# Patient Record
Sex: Male | Born: 1990 | Race: Black or African American | Hispanic: No | Marital: Single | Smoking: Current every day smoker
Health system: Southern US, Community
[De-identification: ages and names within clinical notes are randomized; demographics above are authoritative.]

## PROBLEM LIST (undated history)

## (undated) DIAGNOSIS — H3320 Serous retinal detachment, unspecified eye: Secondary | ICD-10-CM

## (undated) DIAGNOSIS — Z905 Acquired absence of kidney: Secondary | ICD-10-CM

## (undated) DIAGNOSIS — T148XXA Other injury of unspecified body region, initial encounter: Secondary | ICD-10-CM

## (undated) DIAGNOSIS — C801 Malignant (primary) neoplasm, unspecified: Secondary | ICD-10-CM

## (undated) HISTORY — PX: OTHER SURGICAL HISTORY: SHX169

## (undated) HISTORY — PX: NEPHRECTOMY: SHX65

## (undated) HISTORY — PX: EYE SURGERY: SHX253

---

## 2012-07-11 ENCOUNTER — Encounter (HOSPITAL_COMMUNITY): Payer: Self-pay | Admitting: *Deleted

## 2012-07-11 ENCOUNTER — Emergency Department (HOSPITAL_COMMUNITY)
Admission: EM | Admit: 2012-07-11 | Discharge: 2012-07-11 | Disposition: A | Discharge status: Home / Self Care | Payer: Federal, State, Local not specified - PPO | Attending: Emergency Medicine | Admitting: Emergency Medicine

## 2012-07-11 DIAGNOSIS — J029 Acute pharyngitis, unspecified: Secondary | ICD-10-CM

## 2012-07-11 DIAGNOSIS — Z85528 Personal history of other malignant neoplasm of kidney: Secondary | ICD-10-CM | POA: Diagnosis not present

## 2012-07-11 DIAGNOSIS — J039 Acute tonsillitis, unspecified: Secondary | ICD-10-CM | POA: Diagnosis not present

## 2012-07-11 LAB — RAPID STREP SCREEN (MED CTR MEBANE ONLY): Streptococcus, Group A Screen (Direct): NEGATIVE

## 2012-07-11 MED ORDER — BENZOCAINE 20 % MT SOLN: 1.0000 "application " | Freq: Three times a day (TID) | OROMUCOSAL | Status: DC | PRN

## 2012-07-11 MED ORDER — DEXAMETHASONE 10 MG/ML FOR PEDIATRIC ORAL USE
10.0000 mg | Freq: Once | INTRAMUSCULAR | Status: AC
  Administered 2012-07-11: 10 mg via ORAL
  Filled 2012-07-11: qty 1

## 2012-07-11 MED ORDER — PENICILLIN G BENZATHINE 1200000 UNIT/2ML IM SUSP
1.2000 10*6.[IU] | Freq: Once | INTRAMUSCULAR | Status: AC
  Administered 2012-07-11: 1.2 10*6.[IU] via INTRAMUSCULAR
  Filled 2012-07-11: qty 2

## 2012-07-11 NOTE — ED Notes (Signed)
Discharge instructions reviewed. Pt verbalized understanding.  

## 2012-07-11 NOTE — ED Provider Notes (Signed)
History    This chart was scribed for non-physician practitioner working with Joe Benton, * by ED Scribe, Joe Benton. This patient was seen in room TR11C/TR11C and the patient's care was started at 4:26 PM.  CSN: [id number]  Arrival date & time 07/11/12  1416   First MD Initiated Contact with Patient 07/11/12 1626      Chief Complaint  Patient presents with  . Sore Throat    (Consider location/radiation/quality/duration/timing/severity/associated sxs/prior treatment) Patient is a 22 y.o. male presenting with pharyngitis. The history is provided by the patient. Benton language interpreter was used.  Sore Throat Pertinent negatives include Benton chest pain, Benton abdominal pain, Benton headaches and Benton shortness of breath.   Joe Benton is a 22 y.o. male who presents to the Emergency Department complaining of moderate constant sore throat onset a week ago. Pt complains that his tonsils are red and swollen. Pt reports that he has had episodic fevers at home with associated cough and ha's. Pt reports he has had strep twice in the last year. Pt says he has trouble swallowing due to sore throat. Pt denies chills, cough, nausea, vomiting, diarrhea, SOB, weakness, and any other associated symptoms. Pt stated that he just moved from [place]. Past medical hx includes kidney cancer. Kidney was removed when 22 y/o.  Patient also states he has chronic enlarged tonsils and he is discussed with ENT. their removal, but has not had surgery.   History reviewed. Benton pertinent past medical history.  Past Surgical History  Procedure Laterality Date  . Kidney cancer      22 y/o.  kidney removed    History reviewed. Benton pertinent family history.  History  Substance Use Topics  . Smoking status: Never Smoker   . Smokeless tobacco: Not on file  . Alcohol Use: Yes     Comment: socially      Review of Systems  Constitutional: Negative for fever, diaphoresis, appetite change, fatigue and unexpected weight  change.  HENT: Positive for sore throat. Negative for drooling, mouth sores, trouble swallowing, neck stiffness and voice change.   Respiratory: Negative for cough, chest tightness, shortness of breath and wheezing.   Cardiovascular: Negative for chest pain.  Gastrointestinal: Negative for nausea, vomiting, abdominal pain, diarrhea and constipation.  Genitourinary: Negative for dysuria, urgency, frequency and hematuria.  Musculoskeletal: Negative for arthralgias.  Skin: Negative for rash.  Allergic/Immunologic: Negative for immunocompromised state.  Neurological: Negative for syncope, light-headedness and headaches.  Hematological: Does not bruise/bleed easily.  Psychiatric/Behavioral: The patient is not nervous/anxious.     Allergies  Review of patient's allergies indicates Benton known allergies.  Home Medications   Current Outpatient Rx  Name  Route  Sig  Dispense  Refill  . benzocaine (HURRICAINE) 20 % SOLN   Mouth/Throat   Use as directed 1 application in the mouth or throat 3 (three) times daily as needed.   9.75 mL   0     BP 117/97  Pulse 79  Temp(Src) 98.7 F (37.1 C)  Resp 19  SpO2 100%  Physical Exam  Constitutional: He is oriented to person, place, and time. He appears well-developed and well-nourished. Benton distress.  HENT:  Head: Normocephalic and atraumatic.  Right Ear: Tympanic membrane, external ear and ear canal normal. Tympanic membrane is not erythematous and not bulging.  Left Ear: Tympanic membrane, external ear and ear canal normal. Tympanic membrane is not erythematous and not bulging.  Nose: Mucosal edema and rhinorrhea present. Benton epistaxis. Right sinus exhibits  Benton maxillary sinus tenderness and Benton frontal sinus tenderness. Left sinus exhibits Benton maxillary sinus tenderness and Benton frontal sinus tenderness.  Mouth/Throat: Uvula is midline and mucous membranes are normal. Mucous membranes are not pale and not cyanotic. Benton edematous. Oropharyngeal exudate,  posterior oropharyngeal edema and posterior oropharyngeal erythema present. Benton tonsillar abscesses.  Canals are clear. mild mucosa edema. Tonsils are adnopathy. Benton other head or cervical endopathy   Eyes: Conjunctivae are normal. Pupils are equal, round, and reactive to light.  Neck: Normal range of motion and full passive range of motion without pain. Benton spinous process tenderness and Benton muscular tenderness present. Benton rigidity. Normal range of motion present. Benton Brudzinski's sign and Benton Kernig's sign noted.  Cardiovascular: Normal rate, normal heart sounds and intact distal pulses.   Pulmonary/Chest: Effort normal. Benton stridor. Benton respiratory distress. He has Benton rales.  Abdominal: Soft. Bowel sounds are normal.  Musculoskeletal: Normal range of motion.  Lymphadenopathy:       Head (right side): Tonsillar adenopathy present. Benton submental, Benton submandibular, Benton preauricular, Benton posterior auricular and Benton occipital adenopathy present.       Head (left side): Tonsillar adenopathy present. Benton submental, Benton submandibular, Benton preauricular, Benton posterior auricular and Benton occipital adenopathy present.    He has Benton cervical adenopathy.  Neurological: He is alert and oriented to person, place, and time.  Skin: Skin is dry. Benton rash noted. He is not diaphoretic.  Psychiatric: He has a normal mood and affect.    ED Course  Procedures (including critical care time) DIAGNOSTIC STUDIES: Oxygen Saturation is 100% on room air, normal by my interpretation.    COORDINATION OF CARE:   4:42 PM Discussed ED treatment with pt and pt agrees.   Labs Reviewed  RAPID STREP SCREEN   Benton results found.   1. Tonsillitis   2. Sore throat       MDM  Joe Benton presents for sore throat, DDX: Pharyngitis, strep throat, mono.  Patient with negative strep test; but swollen, erythematous tonsils with exudate on exam.  Pt afebrile with tonsillar exudate, cervical lymphadenopathy, & painful swallowing; diagnosis  of pharyngitis. Treated in the Ed with steroids and PCN IM.  Pt appears mildly dehydrated, discussed importance of water rehydration. Presentation non concerning for PTA or infxn spread to soft tissue. Benton trismus or uvula deviation. Specific return precautions discussed. Pt able to drink water in ED without difficulty with intact air way. Recommended PCP follow up.   1. Medications: Hurricaine spray, usual home medications 2. Treatment: rest, drink plenty of fluids, use as directed 3. Follow Up: Please followup with your primary doctor for discussion of your diagnoses and further evaluation after today's visit; if you do not have a primary care doctor use the resource guide provided to find one;   I personally performed the services described in this documentation, which was scribed in my presence. The recorded information has been reviewed and is accurate.          Dahlia Client Denman Pichardo, PA-C 07/11/12 1708

## 2012-07-11 NOTE — ED Notes (Addendum)
Pt states he was born with enlarged tonsils and when the seasons change he gets swelling. Pt has difficulty swallowing, eating and burning of his throat. Rates pain 7/10.

## 2012-07-11 NOTE — ED Notes (Signed)
Pt reports swollen tonsils and sore throat x 1 week.  States that he has had a couple of fevers.  Pts throat noted to be red and tonsils are swollen.  No airway obstruction noted.

## 2012-07-16 NOTE — ED Provider Notes (Signed)
Medical screening examination/treatment/procedure(s) were performed by non-physician practitioner and as supervising physician I was immediately available for consultation/collaboration.  Gilda Crease, MD 07/16/12 [phone]

## 2013-10-04 ENCOUNTER — Emergency Department (HOSPITAL_COMMUNITY): Payer: 59

## 2013-10-04 ENCOUNTER — Inpatient Hospital Stay (HOSPITAL_COMMUNITY)
Admission: EM | Admit: 2013-10-04 | Discharge: 2013-10-06 | DRG: 964 | Disposition: A | Discharge status: Home / Self Care | Payer: 59 | Attending: General Surgery | Admitting: General Surgery

## 2013-10-04 ENCOUNTER — Encounter (HOSPITAL_COMMUNITY): Payer: Self-pay | Admitting: Emergency Medicine

## 2013-10-04 DIAGNOSIS — F1210 Cannabis abuse, uncomplicated: Secondary | ICD-10-CM | POA: Diagnosis present

## 2013-10-04 DIAGNOSIS — Z905 Acquired absence of kidney: Secondary | ICD-10-CM

## 2013-10-04 DIAGNOSIS — R3129 Other microscopic hematuria: Secondary | ICD-10-CM | POA: Diagnosis present

## 2013-10-04 DIAGNOSIS — S31109A Unspecified open wound of abdominal wall, unspecified quadrant without penetration into peritoneal cavity, initial encounter: Secondary | ICD-10-CM | POA: Diagnosis present

## 2013-10-04 DIAGNOSIS — T07XXXA Unspecified multiple injuries, initial encounter: Secondary | ICD-10-CM | POA: Diagnosis present

## 2013-10-04 DIAGNOSIS — S2190XA Unspecified open wound of unspecified part of thorax, initial encounter: Secondary | ICD-10-CM | POA: Diagnosis present

## 2013-10-04 DIAGNOSIS — S21209A Unspecified open wound of unspecified back wall of thorax without penetration into thoracic cavity, initial encounter: Secondary | ICD-10-CM

## 2013-10-04 DIAGNOSIS — D62 Acute posthemorrhagic anemia: Secondary | ICD-10-CM | POA: Diagnosis present

## 2013-10-04 DIAGNOSIS — S270XXA Traumatic pneumothorax, initial encounter: Secondary | ICD-10-CM

## 2013-10-04 DIAGNOSIS — F17200 Nicotine dependence, unspecified, uncomplicated: Secondary | ICD-10-CM | POA: Diagnosis present

## 2013-10-04 DIAGNOSIS — S37039A Laceration of unspecified kidney, unspecified degree, initial encounter: Secondary | ICD-10-CM

## 2013-10-04 DIAGNOSIS — S41109A Unspecified open wound of unspecified upper arm, initial encounter: Secondary | ICD-10-CM | POA: Diagnosis present

## 2013-10-04 DIAGNOSIS — S36113A Laceration of liver, unspecified degree, initial encounter: Secondary | ICD-10-CM | POA: Diagnosis present

## 2013-10-04 DIAGNOSIS — J939 Pneumothorax, unspecified: Secondary | ICD-10-CM

## 2013-10-04 HISTORY — DX: Acquired absence of kidney: Z90.5

## 2013-10-04 LAB — PROTIME-INR
INR: 0.95 (ref 0.00–1.49)
Prothrombin Time: 12.5 seconds (ref 11.6–15.2)

## 2013-10-04 LAB — COMPREHENSIVE METABOLIC PANEL
ALBUMIN: 3.4 g/dL — AB (ref 3.5–5.2)
ALK PHOS: 76 U/L (ref 39–117)
ALT: 17 U/L (ref 0–53)
AST: 28 U/L (ref 0–37)
BUN: 18 mg/dL (ref 6–23)
CALCIUM: 9.2 mg/dL (ref 8.4–10.5)
CO2: 26 mEq/L (ref 19–32)
Chloride: 100 mEq/L (ref 96–112)
Creatinine, Ser: 1.4 mg/dL — ABNORMAL HIGH (ref 0.50–1.35)
GFR calc Af Amer: 81 mL/min — ABNORMAL LOW (ref >90)
GFR calc non Af Amer: 70 mL/min — ABNORMAL LOW (ref >90)
Glucose, Bld: 103 mg/dL — ABNORMAL HIGH (ref 70–99)
POTASSIUM: 3.7 meq/L (ref 3.7–5.3)
SODIUM: 139 meq/L (ref 137–147)
TOTAL PROTEIN: 7.2 g/dL (ref 6.0–8.3)
Total Bilirubin: <0.2 mg/dL — ABNORMAL LOW (ref 0.3–1.2)

## 2013-10-04 LAB — CBC
HEMATOCRIT: 34.6 % — AB (ref 39.0–52.0)
Hemoglobin: 12 g/dL — ABNORMAL LOW (ref 13.0–17.0)
MCH: 32.9 pg (ref 26.0–34.0)
MCHC: 34.7 g/dL (ref 30.0–36.0)
MCV: 94.8 fL (ref 78.0–100.0)
Platelets: 261 10*3/uL (ref 150–400)
RBC: 3.65 MIL/uL — ABNORMAL LOW (ref 4.22–5.81)
RDW: 12.8 % (ref 11.5–15.5)
WBC: 5 10*3/uL (ref 4.0–10.5)

## 2013-10-04 LAB — I-STAT CHEM 8, ED
BUN: 19 mg/dL (ref 6–23)
CHLORIDE: 98 meq/L (ref 96–112)
Calcium, Ion: 1.23 mmol/L (ref 1.12–1.23)
Creatinine, Ser: 1.5 mg/dL — ABNORMAL HIGH (ref 0.50–1.35)
GLUCOSE: 101 mg/dL — AB (ref 70–99)
HCT: 37 % — ABNORMAL LOW (ref 39.0–52.0)
Hemoglobin: 12.6 g/dL — ABNORMAL LOW (ref 13.0–17.0)
Potassium: 3.6 mEq/L — ABNORMAL LOW (ref 3.7–5.3)
Sodium: 143 mEq/L (ref 137–147)
TCO2: 27 mmol/L (ref 0–100)

## 2013-10-04 LAB — CDS SEROLOGY

## 2013-10-04 LAB — ETHANOL: Alcohol, Ethyl (B): <11 mg/dL (ref 0–11)

## 2013-10-04 MED ORDER — FENTANYL CITRATE 0.05 MG/ML IJ SOLN
50.0000 ug | Freq: Once | INTRAMUSCULAR | Status: AC
  Administered 2013-10-04: 50 ug via INTRAVENOUS
  Filled 2013-10-04: qty 2

## 2013-10-04 MED ORDER — TETANUS-DIPHTH-ACELL PERTUSSIS 5-2.5-18.5 LF-MCG/0.5 IM SUSP
0.5000 mL | Freq: Once | INTRAMUSCULAR | Status: AC
  Administered 2013-10-04: 0.5 mL via INTRAMUSCULAR
  Filled 2013-10-04: qty 0.5

## 2013-10-04 MED ORDER — IOHEXOL 300 MG/ML  SOLN
80.0000 mL | Freq: Once | INTRAMUSCULAR | Status: AC | PRN
  Administered 2013-10-04: 80 mL via INTRAVENOUS

## 2013-10-04 NOTE — ED Notes (Signed)
Patient's Belongings:  1 necklace with medalion $20 bill x2 $5 bill x1 $1 Bill x36 1 Pair of sneakers 1 Pair of shorts 1 Pair of underwear 1 Pair of socks

## 2013-10-04 NOTE — ED Notes (Signed)
MD at bedside. 
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
## 2013-10-04 NOTE — ED Notes (Signed)
Pt en route to CT scanner at this time.  

## 2013-10-05 ENCOUNTER — Encounter (HOSPITAL_COMMUNITY): Payer: Self-pay | Admitting: *Deleted

## 2013-10-05 ENCOUNTER — Inpatient Hospital Stay (HOSPITAL_COMMUNITY): Payer: 59

## 2013-10-05 DIAGNOSIS — D62 Acute posthemorrhagic anemia: Secondary | ICD-10-CM

## 2013-10-05 LAB — TYPE AND SCREEN
ABO/RH(D): A POS
ANTIBODY SCREEN: NEGATIVE
UNIT DIVISION: 0
Unit division: 0

## 2013-10-05 LAB — PREPARE FRESH FROZEN PLASMA
UNIT DIVISION: 0
Unit division: 0

## 2013-10-05 LAB — CBC
HCT: 31.5 % — ABNORMAL LOW (ref 39.0–52.0)
HCT: 34.7 % — ABNORMAL LOW (ref 39.0–52.0)
HCT: 35.7 % — ABNORMAL LOW (ref 39.0–52.0)
HEMATOCRIT: 31.8 % — AB (ref 39.0–52.0)
HEMOGLOBIN: 11.9 g/dL — AB (ref 13.0–17.0)
Hemoglobin: 10.8 g/dL — ABNORMAL LOW (ref 13.0–17.0)
Hemoglobin: 11 g/dL — ABNORMAL LOW (ref 13.0–17.0)
Hemoglobin: 12.1 g/dL — ABNORMAL LOW (ref 13.0–17.0)
MCH: 32.6 pg (ref 26.0–34.0)
MCH: 32.8 pg (ref 26.0–34.0)
MCH: 32.9 pg (ref 26.0–34.0)
MCH: 33.1 pg (ref 26.0–34.0)
MCHC: 34.3 g/dL (ref 30.0–36.0)
MCHC: 34.6 g/dL (ref 30.0–36.0)
MCHC: 34.3 g/dL (ref 30.0–36.0)
MCHC: 33.9 g/dL (ref 30.0–36.0)
MCV: 95.2 fL (ref 78.0–100.0)
MCV: 95.8 fL (ref 78.0–100.0)
MCV: 95.9 fL (ref 78.0–100.0)
MCV: 96.7 fL (ref 78.0–100.0)
PLATELETS: 213 10*3/uL (ref 150–400)
Platelets: 203 10*3/uL (ref 150–400)
Platelets: 247 10*3/uL (ref 150–400)
Platelets: 236 10*3/uL (ref 150–400)
RBC: 3.31 MIL/uL — ABNORMAL LOW (ref 4.22–5.81)
RBC: 3.32 MIL/uL — AB (ref 4.22–5.81)
RBC: 3.62 MIL/uL — ABNORMAL LOW (ref 4.22–5.81)
RBC: 3.69 MIL/uL — ABNORMAL LOW (ref 4.22–5.81)
RDW: 12.9 % (ref 11.5–15.5)
RDW: 13.1 % (ref 11.5–15.5)
RDW: 13.1 % (ref 11.5–15.5)
RDW: 13.2 % (ref 11.5–15.5)
WBC: 8.2 10*3/uL (ref 4.0–10.5)
WBC: 7.2 10*3/uL (ref 4.0–10.5)
WBC: 6.4 10*3/uL (ref 4.0–10.5)
WBC: 5.2 10*3/uL (ref 4.0–10.5)

## 2013-10-05 LAB — URINALYSIS, ROUTINE W REFLEX MICROSCOPIC
BILIRUBIN URINE: NEGATIVE
Glucose, UA: NEGATIVE mg/dL
Ketones, ur: NEGATIVE mg/dL
Leukocytes, UA: NEGATIVE
NITRITE: NEGATIVE
PH: 6 (ref 5.0–8.0)
Protein, ur: 30 mg/dL — AB
SPECIFIC GRAVITY, URINE: 1.015 (ref 1.005–1.030)
Urobilinogen, UA: 0.2 mg/dL (ref 0.0–1.0)

## 2013-10-05 LAB — URINE MICROSCOPIC-ADD ON

## 2013-10-05 LAB — MRSA PCR SCREENING: MRSA by PCR: NEGATIVE

## 2013-10-05 LAB — ABO/RH: ABO/RH(D): A POS

## 2013-10-05 MED ORDER — ONDANSETRON HCL 4 MG/2ML IJ SOLN: 4.0000 mg | Freq: Four times a day (QID) | INTRAMUSCULAR | Status: DC | PRN

## 2013-10-05 MED ORDER — KCL IN DEXTROSE-NACL 20-5-0.45 MEQ/L-%-% IV SOLN
INTRAVENOUS | Status: DC
  Administered 2013-10-05: 22:00:00 via INTRAVENOUS
  Administered 2013-10-05: 1000 mL via INTRAVENOUS
  Filled 2013-10-05 (×8): qty 1000

## 2013-10-05 MED ORDER — MORPHINE SULFATE 2 MG/ML IJ SOLN
1.0000 mg | INTRAMUSCULAR | Status: DC | PRN
  Administered 2013-10-05 (×4): 2 mg via INTRAVENOUS
  Filled 2013-10-05 (×4): qty 1

## 2013-10-05 MED ORDER — ONDANSETRON HCL 4 MG PO TABS: 4.0000 mg | ORAL_TABLET | Freq: Four times a day (QID) | ORAL | Status: DC | PRN

## 2013-10-05 NOTE — Progress Notes (Signed)
I have seen and examined the patient and agree with the assessment and plans. Hemodynamically stable, no signs of ongoing bleeding. Transfer and start po  Joe Alfred A. Ninfa Linden  MD, FACS
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
## 2013-10-06 ENCOUNTER — Inpatient Hospital Stay (HOSPITAL_COMMUNITY): Payer: 59

## 2013-10-06 LAB — CBC
HCT: 32.8 % — ABNORMAL LOW (ref 39.0–52.0)
HEMATOCRIT: 33.4 % — AB (ref 39.0–52.0)
HEMATOCRIT: 37 % — AB (ref 39.0–52.0)
HEMOGLOBIN: 11.1 g/dL — AB (ref 13.0–17.0)
HEMOGLOBIN: 12.6 g/dL — AB (ref 13.0–17.0)
Hemoglobin: 11.1 g/dL — ABNORMAL LOW (ref 13.0–17.0)
MCH: 32 pg (ref 26.0–34.0)
MCH: 32.5 pg (ref 26.0–34.0)
MCH: 32.7 pg (ref 26.0–34.0)
MCHC: 33.8 g/dL (ref 30.0–36.0)
MCHC: 33.2 g/dL (ref 30.0–36.0)
MCHC: 34.1 g/dL (ref 30.0–36.0)
MCV: 95.9 fL (ref 78.0–100.0)
MCV: 96.1 fL (ref 78.0–100.0)
MCV: 96.3 fL (ref 78.0–100.0)
PLATELETS: 225 10*3/uL (ref 150–400)
PLATELETS: 248 10*3/uL (ref 150–400)
Platelets: 219 10*3/uL (ref 150–400)
RBC: 3.42 MIL/uL — AB (ref 4.22–5.81)
RBC: 3.47 MIL/uL — AB (ref 4.22–5.81)
RBC: 3.85 MIL/uL — AB (ref 4.22–5.81)
RDW: 13 % (ref 11.5–15.5)
RDW: 13.1 % (ref 11.5–15.5)
RDW: 13 % (ref 11.5–15.5)
WBC: 4.1 10*3/uL (ref 4.0–10.5)
WBC: 4 10*3/uL (ref 4.0–10.5)
WBC: 4.5 10*3/uL (ref 4.0–10.5)

## 2013-10-06 MED ORDER — HYDROCODONE-ACETAMINOPHEN 5-325 MG PO TABS: 1.0000 | ORAL_TABLET | Freq: Three times a day (TID) | ORAL | Status: DC | PRN

## 2013-10-06 MED ORDER — ACETAMINOPHEN 325 MG PO TABS: 650.0000 mg | ORAL_TABLET | Freq: Four times a day (QID) | ORAL | Status: DC | PRN

## 2013-10-06 NOTE — Progress Notes (Signed)
Up walking in hallway. Doing well and tolerating well. Regular diet ordered from kitchen. Melford Aase, RN 10/06/2013 1120
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
## 2013-10-06 NOTE — Progress Notes (Signed)
Patient discharged. Accompanied by fiance. Ambulatory.
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
## 2013-10-07 ENCOUNTER — Encounter (HOSPITAL_COMMUNITY): Payer: Self-pay | Admitting: *Deleted

## 2013-10-16 ENCOUNTER — Ambulatory Visit (INDEPENDENT_AMBULATORY_CARE_PROVIDER_SITE_OTHER): Payer: Self-pay | Admitting: General Surgery

## 2013-10-16 ENCOUNTER — Encounter (INDEPENDENT_AMBULATORY_CARE_PROVIDER_SITE_OTHER): Payer: Self-pay

## 2013-10-16 ENCOUNTER — Encounter (INDEPENDENT_AMBULATORY_CARE_PROVIDER_SITE_OTHER): Payer: Self-pay | Admitting: *Deleted

## 2013-10-16 VITALS — BP 112/80 | HR 72 | Temp 98.5°F | Resp 14 | Ht 69.0 in | Wt 144.2 lb

## 2013-10-16 DIAGNOSIS — Z5189 Encounter for other specified aftercare: Secondary | ICD-10-CM

## 2013-10-16 DIAGNOSIS — T148XXA Other injury of unspecified body region, initial encounter: Secondary | ICD-10-CM

## 2013-10-16 DIAGNOSIS — S36119A Unspecified injury of liver, initial encounter: Secondary | ICD-10-CM

## 2013-10-16 DIAGNOSIS — S37039A Laceration of unspecified kidney, unspecified degree, initial encounter: Secondary | ICD-10-CM

## 2013-10-16 DIAGNOSIS — D62 Acute posthemorrhagic anemia: Secondary | ICD-10-CM

## 2013-10-16 DIAGNOSIS — S36113D Laceration of liver, unspecified degree, subsequent encounter: Secondary | ICD-10-CM

## 2013-10-16 DIAGNOSIS — S37031D Laceration of right kidney, unspecified degree, subsequent encounter: Secondary | ICD-10-CM

## 2013-10-16 MED ORDER — NAPROXEN 500 MG PO TABS: 500.0000 mg | ORAL_TABLET | Freq: Two times a day (BID) | ORAL | Status: AC

## 2013-10-16 MED ORDER — HYDROCODONE-ACETAMINOPHEN 5-325 MG PO TABS: 20.0000 | ORAL_TABLET | Freq: Three times a day (TID) | ORAL | Status: DC | PRN
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
## 2013-10-16 NOTE — Patient Instructions (Signed)
Shower as normal.  Okay to return to work on [employer] duty.  No lifting >20lbs for 6 weeks.  Keep your appointment with Dr. Louis Meckel in 3 months.  Follow up as needed.

## 2013-10-18 DIAGNOSIS — D62 Acute posthemorrhagic anemia: Secondary | ICD-10-CM | POA: Insufficient documentation

## 2013-10-18 DIAGNOSIS — S36113A Laceration of liver, unspecified degree, initial encounter: Secondary | ICD-10-CM | POA: Insufficient documentation

## 2013-10-18 DIAGNOSIS — S37039A Laceration of unspecified kidney, unspecified degree, initial encounter: Secondary | ICD-10-CM | POA: Insufficient documentation
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
## 2014-11-08 IMAGING — CR DG CHEST 2V
2 series · 2 of 2 positions shown · non-contrast
Comparison: Prior chest x-ray 10/05/2013

CLINICAL DATA: Evaluate pneumothorax

EXAM:
CHEST  2 VIEW

[w chest pa]
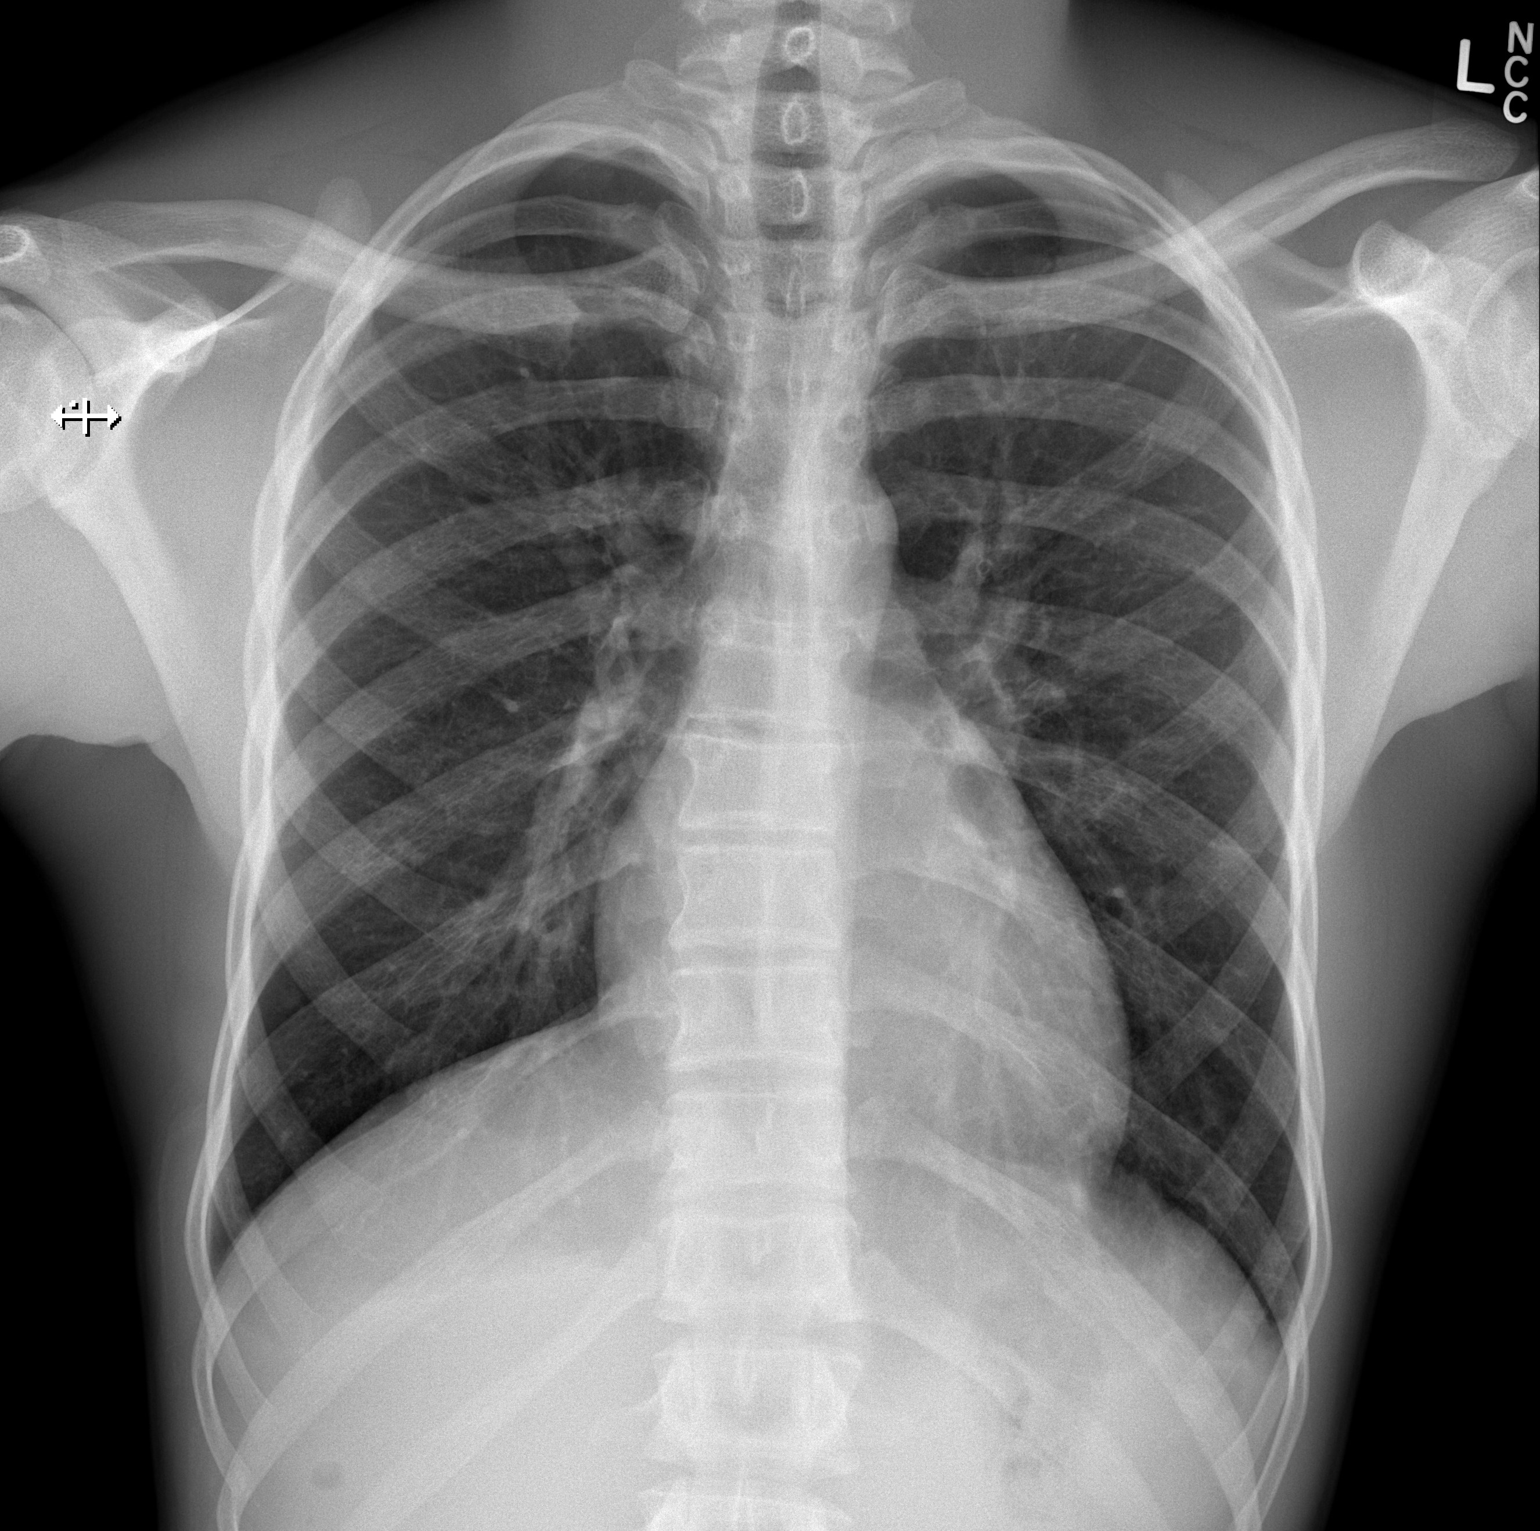

[w chest lat]
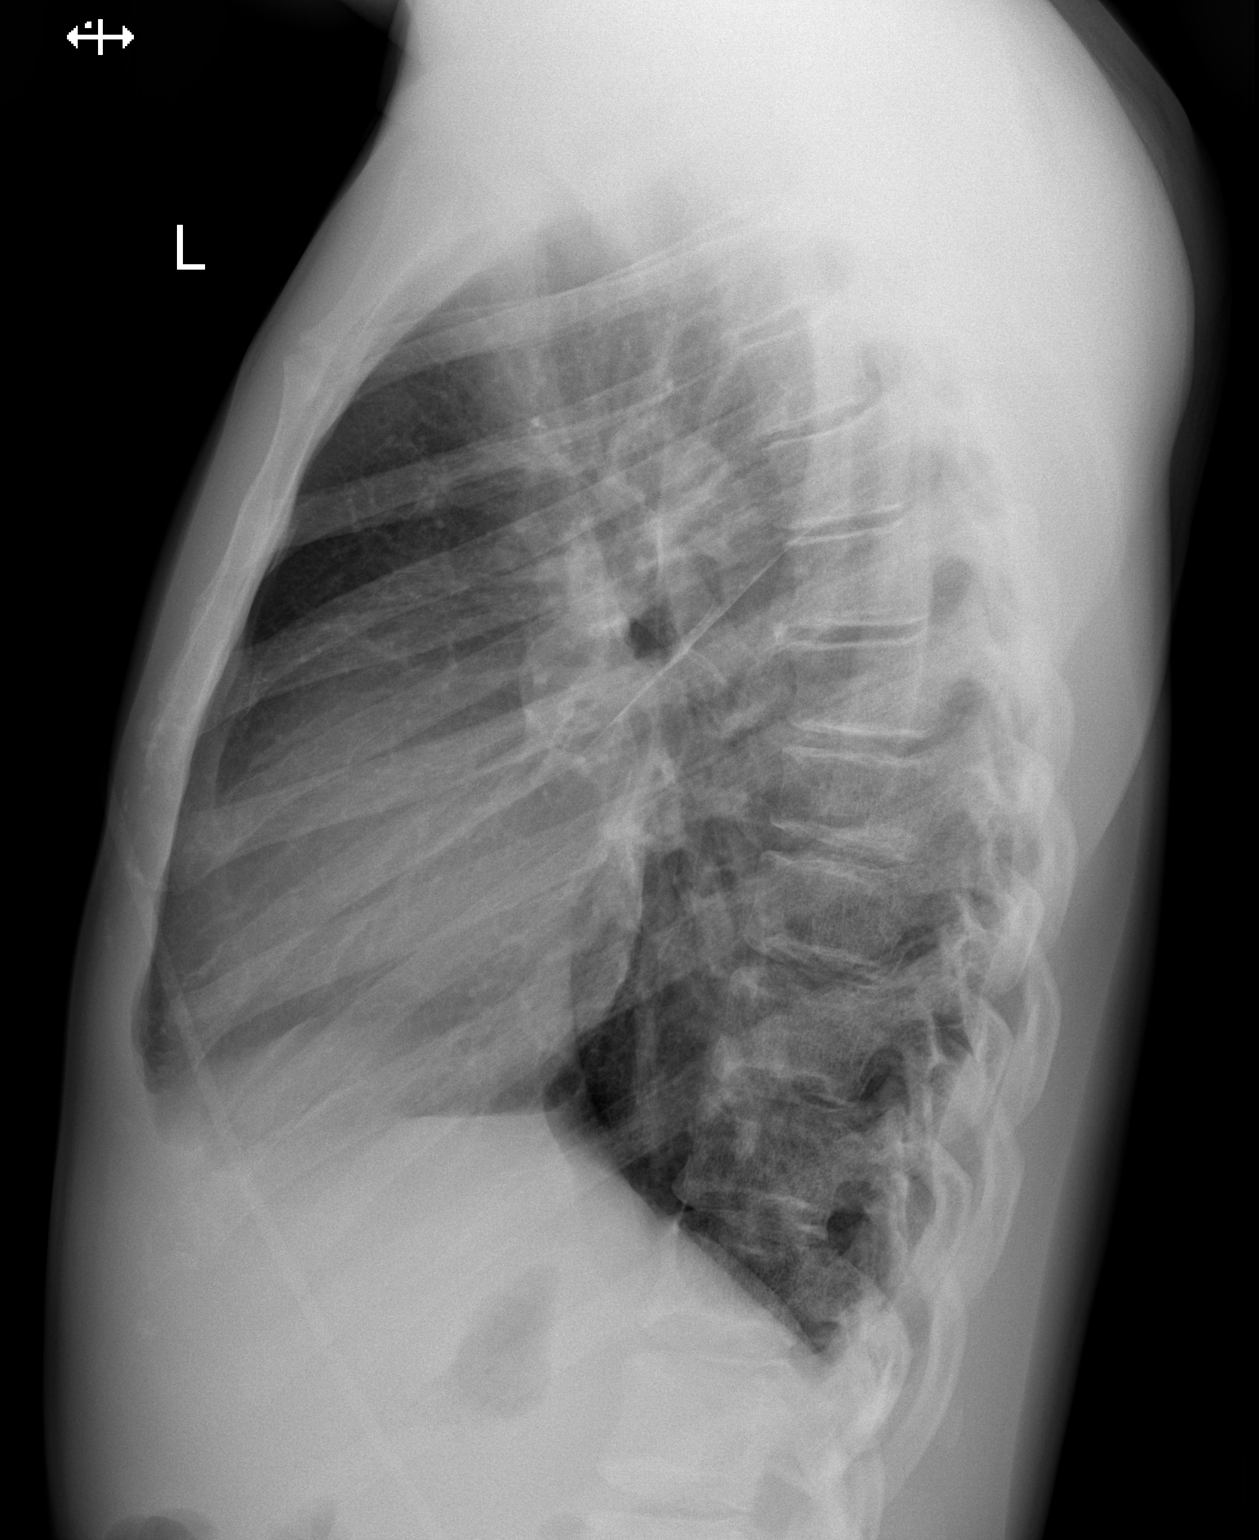

[2 of 2 positions shown; findings below may reference images not displayed]

FINDINGS: The lungs are clear and negative for focal airspace consolidation,
pulmonary edema or suspicious pulmonary nodule. No pleural effusion
or pneumothorax. Cardiac and mediastinal contours are within normal
limits. No acute fracture or lytic or blastic osseous lesions. The
visualized upper abdominal bowel gas pattern is unremarkable.
IMPRESSION: No active cardiopulmonary disease.

No definite pneumothorax identified on today's exam.

## 2016-11-05 ENCOUNTER — Emergency Department
Admission: EM | Admit: 2016-11-05 | Discharge: 2016-11-05 | Disposition: A | Discharge status: Home / Self Care | Payer: Federal, State, Local not specified - PPO | Attending: Emergency Medicine | Admitting: Emergency Medicine

## 2016-11-05 ENCOUNTER — Encounter: Payer: Self-pay | Admitting: Emergency Medicine

## 2016-11-05 DIAGNOSIS — S41111A Laceration without foreign body of right upper arm, initial encounter: Secondary | ICD-10-CM | POA: Insufficient documentation

## 2016-11-05 DIAGNOSIS — Y999 Unspecified external cause status: Secondary | ICD-10-CM | POA: Insufficient documentation

## 2016-11-05 DIAGNOSIS — S0181XA Laceration without foreign body of other part of head, initial encounter: Secondary | ICD-10-CM | POA: Insufficient documentation

## 2016-11-05 DIAGNOSIS — S31114A Laceration without foreign body of abdominal wall, left lower quadrant without penetration into peritoneal cavity, initial encounter: Secondary | ICD-10-CM | POA: Insufficient documentation

## 2016-11-05 DIAGNOSIS — Y939 Activity, unspecified: Secondary | ICD-10-CM | POA: Insufficient documentation

## 2016-11-05 DIAGNOSIS — Y929 Unspecified place or not applicable: Secondary | ICD-10-CM | POA: Insufficient documentation

## 2016-11-05 DIAGNOSIS — S41112A Laceration without foreign body of left upper arm, initial encounter: Secondary | ICD-10-CM | POA: Insufficient documentation

## 2016-11-05 DIAGNOSIS — Z23 Encounter for immunization: Secondary | ICD-10-CM | POA: Insufficient documentation

## 2016-11-05 DIAGNOSIS — T07XXXA Unspecified multiple injuries, initial encounter: Secondary | ICD-10-CM

## 2016-11-05 MED ORDER — TETANUS-DIPHTH-ACELL PERTUSSIS 5-2.5-18.5 LF-MCG/0.5 IM SUSP
0.5000 mL | Freq: Once | INTRAMUSCULAR | Status: AC
  Administered 2016-11-05: 0.5 mL via INTRAMUSCULAR
  Filled 2016-11-05: qty 0.5

## 2016-11-05 MED ORDER — LIDOCAINE HCL (PF) 1 % IJ SOLN
INTRAMUSCULAR | Status: AC
  Filled 2016-11-05: qty 5
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
## 2016-11-05 NOTE — ED Notes (Signed)
ED Provider at bedside. 
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
## 2016-11-05 NOTE — ED Notes (Signed)
Pt given orange juice to drink, attempted to call ride with no answer, pt sleeping, will continue to call pts ride
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
## 2016-11-05 NOTE — ED Notes (Signed)
Pt resting in bed, resp even and unlabored, eyes closed, pt in no acute distress

## 2017-11-08 ENCOUNTER — Emergency Department (HOSPITAL_COMMUNITY)
Admission: EM | Admit: 2017-11-08 | Discharge: 2017-11-08 | Disposition: A | Discharge status: Home / Self Care | Payer: Federal, State, Local not specified - PPO | Attending: Emergency Medicine | Admitting: Emergency Medicine

## 2017-11-08 ENCOUNTER — Encounter (HOSPITAL_COMMUNITY): Payer: Self-pay

## 2017-11-08 ENCOUNTER — Other Ambulatory Visit: Payer: Self-pay

## 2017-11-08 DIAGNOSIS — K0510 Chronic gingivitis, plaque induced: Secondary | ICD-10-CM | POA: Insufficient documentation

## 2017-11-08 DIAGNOSIS — F17210 Nicotine dependence, cigarettes, uncomplicated: Secondary | ICD-10-CM | POA: Insufficient documentation

## 2017-11-08 DIAGNOSIS — J019 Acute sinusitis, unspecified: Secondary | ICD-10-CM | POA: Insufficient documentation

## 2017-11-08 DIAGNOSIS — R0981 Nasal congestion: Secondary | ICD-10-CM | POA: Insufficient documentation

## 2017-11-08 DIAGNOSIS — Z85528 Personal history of other malignant neoplasm of kidney: Secondary | ICD-10-CM | POA: Insufficient documentation

## 2017-11-08 DIAGNOSIS — R51 Headache: Secondary | ICD-10-CM | POA: Insufficient documentation

## 2017-11-08 DIAGNOSIS — F1210 Cannabis abuse, uncomplicated: Secondary | ICD-10-CM | POA: Insufficient documentation

## 2017-11-08 HISTORY — DX: Serous retinal detachment, unspecified eye: H33.20

## 2017-11-08 HISTORY — DX: Malignant (primary) neoplasm, unspecified: C80.1

## 2017-11-08 HISTORY — DX: Other injury of unspecified body region, initial encounter: T14.8XXA

## 2017-11-08 MED ORDER — FLUTICASONE PROPIONATE 50 MCG/ACT NA SUSP: 1.0000 | Freq: Every day | NASAL | 2 refills | Status: DC

## 2017-11-08 MED ORDER — LORATADINE-PSEUDOEPHEDRINE ER 5-120 MG PO TB12: 1.0000 | ORAL_TABLET | Freq: Two times a day (BID) | ORAL | 0 refills | Status: DC

## 2017-11-08 MED ORDER — IBUPROFEN 600 MG PO TABS: 600.0000 mg | ORAL_TABLET | Freq: Four times a day (QID) | ORAL | 0 refills | Status: DC | PRN
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
## 2017-11-08 NOTE — Discharge Instructions (Signed)
1.  Start Claritin-D and Flonase as prescribed.  Use for 2 weeks.  Use ibuprofen as needed for headache or achiness. 2.  Start treating your gingivitis.  Floss daily and pressure teeth twice daily.  Swish with a solution of half hydrogen peroxide and half water for 2 to 3 minutes twice a day.  Schedule appointment with a dentist as soon as possible.

## 2017-11-08 NOTE — ED Triage Notes (Signed)
Patient c/o headache x 5 days. Patient reports nasal congestion x 5 days. Patient reports light sensitivity at times.

## 2019-02-02 ENCOUNTER — Emergency Department (HOSPITAL_COMMUNITY)
Admission: EM | Admit: 2019-02-02 | Discharge: 2019-02-02 | Disposition: A | Discharge status: Home / Self Care | Payer: Self-pay | Attending: Emergency Medicine | Admitting: Emergency Medicine

## 2019-02-02 ENCOUNTER — Encounter (HOSPITAL_COMMUNITY): Payer: Self-pay | Admitting: *Deleted

## 2019-02-02 ENCOUNTER — Other Ambulatory Visit: Payer: Self-pay

## 2019-02-02 DIAGNOSIS — Z202 Contact with and (suspected) exposure to infections with a predominantly sexual mode of transmission: Secondary | ICD-10-CM | POA: Insufficient documentation

## 2019-02-02 DIAGNOSIS — M542 Cervicalgia: Secondary | ICD-10-CM | POA: Insufficient documentation

## 2019-02-02 DIAGNOSIS — Z711 Person with feared health complaint in whom no diagnosis is made: Secondary | ICD-10-CM

## 2019-02-02 DIAGNOSIS — F17210 Nicotine dependence, cigarettes, uncomplicated: Secondary | ICD-10-CM | POA: Insufficient documentation

## 2019-02-02 DIAGNOSIS — F1290 Cannabis use, unspecified, uncomplicated: Secondary | ICD-10-CM | POA: Insufficient documentation

## 2019-02-02 MED ORDER — NAPROXEN 500 MG PO TABS: 500.0000 mg | ORAL_TABLET | Freq: Two times a day (BID) | ORAL | 0 refills | Status: DC

## 2019-02-02 MED ORDER — METHOCARBAMOL 500 MG PO TABS: 500.0000 mg | ORAL_TABLET | Freq: Three times a day (TID) | ORAL | 0 refills | Status: DC | PRN

## 2019-02-02 MED ORDER — LIDOCAINE HCL (PF) 1 % IJ SOLN
INTRAMUSCULAR | Status: AC
  Administered 2019-02-02: 08:00:00 2 mL
  Filled 2019-02-02: qty 5

## 2019-02-02 MED ORDER — AZITHROMYCIN 250 MG PO TABS
1000.0000 mg | ORAL_TABLET | Freq: Once | ORAL | Status: AC
  Administered 2019-02-02: 1000 mg via ORAL
  Filled 2019-02-02: qty 4

## 2019-02-02 MED ORDER — CEFTRIAXONE SODIUM 250 MG IJ SOLR
250.0000 mg | Freq: Once | INTRAMUSCULAR | Status: AC
  Administered 2019-02-02: 08:00:00 250 mg via INTRAMUSCULAR
  Filled 2019-02-02: qty 250

## 2019-02-02 NOTE — ED Triage Notes (Signed)
The pt is c/o neck and cervical spine pain for 24 hours  No known injury
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
## 2019-02-02 NOTE — ED Notes (Signed)
Called for pt x3. No answer.  

## 2019-02-02 NOTE — ED Notes (Signed)
Called from waiting room, no answer x 1.

## 2019-02-02 NOTE — Discharge Instructions (Addendum)
You were seen in the emergency department for neck and back pain today.  At this time we suspect that your pain is related to a muscle strain/spasm.   I have prescribed you an anti-inflammatory medication and a muscle relaxer.  - Naproxen is a nonsteroidal anti-inflammatory medication that will help with pain and swelling. Be sure to take this medication as prescribed with food, 1 pill every 12 hours,  It should be taken with food, as it can cause stomach upset, and more seriously, stomach bleeding. Do not take other nonsteroidal anti-inflammatory medications with this such as Advil, Motrin, Aleve, Mobic, Goodie Powder, or Motrin.    - Robaxin is the muscle relaxer I have prescribed, this is meant to help with muscle tightness. Be aware that this medication may make you drowsy therefore the first time you take this it should be at a time you are in an environment where you can rest. Do not drive or operate heavy machinery when taking this medication. Do not drink alcohol or take other sedating medications with this medicine such as narcotics or benzodiazepines.   You make take Tylenol per over the counter dosing with these medications.   We have prescribed you new medication(s) today. Discuss the medications prescribed today with your pharmacist as they can have adverse effects and interactions with your other medicines including over the counter and prescribed medications. Seek medical evaluation if you start to experience new or abnormal symptoms after taking one of these medicines, seek care immediately if you start to experience difficulty breathing, feeling of your throat closing, facial swelling, or rash as these could be indications of a more serious allergic reaction   The application of heat can help soothe the pain.  Maintaining your daily activities, including walking, is encourged, as it will help you get better faster than just staying in bed.  Your pain should get better over the next 2  weeks.  You will need to follow up with  Your primary healthcare provider in 1-2 weeks for reassessment, if you do not have a primary care provider one is provided in your discharge instructions- you may see the [place] [facility] or call the provided phone number. However return to the ER should you develop ne or worsening symptoms or any other concerns including but not limited to severe or worsening pain, low back pain with fever, numbness, weakness, loss of bowel or bladder control, or inability to walk or urinate, you should return to the ER immediately.     WE have also given you treatment for gonorrhea/chlamydia in the ER.  Do not have intercourse of any kind for 7 days.  Follow up with the health department in 1 week Use protection when sexually active.

## 2019-12-01 ENCOUNTER — Other Ambulatory Visit: Payer: Self-pay

## 2019-12-01 ENCOUNTER — Emergency Department (HOSPITAL_COMMUNITY)
Admission: EM | Admit: 2019-12-01 | Discharge: 2019-12-01 | Disposition: A | Discharge status: Home / Self Care | Payer: Self-pay | Attending: Emergency Medicine | Admitting: Emergency Medicine

## 2019-12-01 ENCOUNTER — Encounter (HOSPITAL_COMMUNITY): Payer: Self-pay | Admitting: Emergency Medicine

## 2019-12-01 DIAGNOSIS — J069 Acute upper respiratory infection, unspecified: Secondary | ICD-10-CM | POA: Insufficient documentation

## 2019-12-01 DIAGNOSIS — R059 Cough, unspecified: Secondary | ICD-10-CM

## 2019-12-01 DIAGNOSIS — F17210 Nicotine dependence, cigarettes, uncomplicated: Secondary | ICD-10-CM | POA: Insufficient documentation

## 2019-12-01 DIAGNOSIS — Z20822 Contact with and (suspected) exposure to covid-19: Secondary | ICD-10-CM | POA: Insufficient documentation

## 2019-12-01 LAB — SARS CORONAVIRUS 2 (TAT 6-24 HRS): SARS Coronavirus 2: NEGATIVE

## 2019-12-01 MED ORDER — PROMETHAZINE-DM 6.25-15 MG/5ML PO SYRP: 5.0000 mL | ORAL_SOLUTION | Freq: Four times a day (QID) | ORAL | 0 refills | Status: DC | PRN

## 2019-12-01 MED ORDER — IBUPROFEN 400 MG PO TABS
800.0000 mg | ORAL_TABLET | Freq: Once | ORAL | Status: AC
  Administered 2019-12-01: 800 mg via ORAL
  Filled 2019-12-01: qty 2

## 2019-12-01 MED ORDER — FLUTICASONE PROPIONATE 50 MCG/ACT NA SUSP: 2.0000 | Freq: Every day | NASAL | 0 refills | Status: DC

## 2019-12-01 NOTE — ED Triage Notes (Signed)
Pt presents with 2 weeks of congestion/cough and generalized body aches. Endorses chills.
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
## 2019-12-01 NOTE — Discharge Instructions (Addendum)
1. Medications: Alternate tylenol and ibuprofen for fever control, Flonase for nasal congestion, Promethazine DM for cough, continue usual home medications 2. Treatment: rest, drink plenty of fluids,  3. Follow Up: Please followup with your primary doctor if your symptoms are not improving after 10-14 days; Please return to the ER for high fevers, persistent vomiting, shortness of breath or other concerns.

## 2022-12-11 ENCOUNTER — Encounter (HOSPITAL_COMMUNITY): Payer: Self-pay

## 2022-12-11 ENCOUNTER — Ambulatory Visit (HOSPITAL_COMMUNITY)
Admission: EM | Admit: 2022-12-11 | Discharge: 2022-12-11 | Disposition: A | Discharge status: Home / Self Care | Payer: Medicaid Other | Attending: Physician Assistant | Admitting: Physician Assistant

## 2022-12-11 DIAGNOSIS — L245 Irritant contact dermatitis due to other chemical products: Secondary | ICD-10-CM | POA: Diagnosis not present

## 2022-12-11 MED ORDER — MUPIROCIN CALCIUM 2 % EX CREA: 1.0000 | TOPICAL_CREAM | Freq: Two times a day (BID) | CUTANEOUS | 0 refills | Status: DC

## 2022-12-11 MED ORDER — PREDNISONE 10 MG (21) PO TBPK: ORAL_TABLET | Freq: Every day | ORAL | 0 refills | Status: DC
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
## 2022-12-11 NOTE — ED Triage Notes (Signed)
Patient reports that he has had dry skin and irritation to both hands x 1 month. 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 Patient states some days his hands drain fluid.

## 2022-12-11 NOTE — Discharge Instructions (Addendum)
Take Prednisone as prescribed Apply topical cream twice a day

## 2022-12-14 ENCOUNTER — Telehealth (HOSPITAL_COMMUNITY): Payer: Self-pay | Admitting: *Deleted

## 2022-12-14 NOTE — Telephone Encounter (Signed)
Pt called and states that cream given at his office visit wasn't covered by insurance. I called pharmacy and changed rx to ointment which is covered by his insurance. Pt advised and verbalized understanding that rx has been changed with pharmacy.

## 2022-12-15 ENCOUNTER — Encounter (HOSPITAL_COMMUNITY): Payer: Self-pay | Admitting: Emergency Medicine

## 2022-12-15 ENCOUNTER — Ambulatory Visit (HOSPITAL_COMMUNITY): Admission: EM | Admit: 2022-12-15 | Discharge: 2022-12-15 | Disposition: A | Discharge status: Home / Self Care

## 2022-12-15 DIAGNOSIS — L245 Irritant contact dermatitis due to other chemical products: Secondary | ICD-10-CM

## 2022-12-15 DIAGNOSIS — R030 Elevated blood-pressure reading, without diagnosis of hypertension: Secondary | ICD-10-CM

## 2022-12-15 NOTE — Discharge Instructions (Addendum)
Pick up the ointment and apply it as prescribed to the backs of your hands twice daily to help with the itching.  You can start taking Zyrtec, Allegra, or Claritin to help with the itching as well.  Continue the prednisone to completion.  Your blood pressure was high today.  Please recheck this at the pharmacy when you pick up your prescription.  A goal blood pressure for you would be less than 120/80.  If you develop chest pain, shortness of breath, headache, lightheadedness, dizziness, or vision changes, please seek emergent care.

## 2022-12-15 NOTE — ED Triage Notes (Signed)
Pt reports that he was here 3 days ago for skin irritation and was prescribed a new cream that his medicaid will cover. Pt reports that he hasn't picked up yet. Pt was told to come back in to get another note for being out of work. Pt was told not to come back to work until skin on his hands healed.
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
## 2022-12-30 ENCOUNTER — Ambulatory Visit (HOSPITAL_COMMUNITY)
Admission: EM | Admit: 2022-12-30 | Discharge: 2022-12-30 | Disposition: A | Discharge status: Home / Self Care | Payer: Medicaid Other
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
## 2024-01-17 ENCOUNTER — Encounter (HOSPITAL_COMMUNITY): Payer: Self-pay

## 2024-01-17 ENCOUNTER — Ambulatory Visit (HOSPITAL_COMMUNITY)
Admission: EM | Admit: 2024-01-17 | Discharge: 2024-01-17 | Disposition: A | Discharge status: Home / Self Care | Payer: No Typology Code available for payment source | Attending: Emergency Medicine | Admitting: Emergency Medicine

## 2024-01-17 DIAGNOSIS — L245 Irritant contact dermatitis due to other chemical products: Secondary | ICD-10-CM | POA: Diagnosis not present

## 2024-01-17 MED ORDER — TRIAMCINOLONE ACETONIDE 0.1 % EX OINT: 1.0000 | TOPICAL_OINTMENT | Freq: Two times a day (BID) | CUTANEOUS | 0 refills | Status: DC

## 2024-01-17 MED ORDER — PREDNISONE 10 MG (21) PO TBPK: ORAL_TABLET | Freq: Every day | ORAL | 0 refills | Status: DC

## 2024-01-17 NOTE — Discharge Instructions (Signed)
 Start taking the oral steroids tomorrow with breakfast.  Apply the topical steroid twice daily and commendation with Eucerin or Vaseline.  Avoid contact with irritants at work.  Symptoms should improve over the next week, if no improvement or any changes return to clinic for reevaluation.

## 2024-01-17 NOTE — ED Triage Notes (Signed)
 Patient is here for rash on his hands x 1 week. Patient has been using vaseline.
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
## 2024-02-15 ENCOUNTER — Ambulatory Visit (HOSPITAL_COMMUNITY)
Admission: EM | Admit: 2024-02-15 | Discharge: 2024-02-15 | Disposition: A | Discharge status: Home / Self Care | Attending: Nurse Practitioner | Admitting: Nurse Practitioner

## 2024-02-15 ENCOUNTER — Encounter (HOSPITAL_COMMUNITY): Payer: Self-pay

## 2024-02-15 DIAGNOSIS — L240 Irritant contact dermatitis due to detergents: Secondary | ICD-10-CM

## 2024-02-15 DIAGNOSIS — L249 Irritant contact dermatitis, unspecified cause: Secondary | ICD-10-CM

## 2024-02-15 MED ORDER — PREDNISONE 10 MG PO TABS: ORAL_TABLET | ORAL | 0 refills | Status: AC

## 2024-02-15 MED ORDER — BETAMETHASONE DIPROPIONATE 0.05 % EX OINT: TOPICAL_OINTMENT | CUTANEOUS | 0 refills | Status: AC

## 2024-02-15 NOTE — Discharge Instructions (Addendum)
 You were seen today for a rash on your hands that is consistent with chronic irritant contact dermatitis. This type of rash often happens when the skin is repeatedly exposed to water, soaps, detergents, or cleaning chemicals, which can cause dryness, irritation, and inflammation. You were prescribed a short course of oral steroids to reduce inflammation and betamethasone ointment to apply to the affected areas twice a day for two weeks. If your rash is not fully improved after two weeks, you may continue using the ointment for up to four weeks. At bedtime, you can mix a small amount of the steroid ointment with a moisturizer such as Aquaphor or Vaseline and apply it to your hands to help lock in moisture overnight. Try to protect your hands as much as possible at work and at home. Change gloves often, dry your hands completely before putting on new gloves, and use fragrance-free emollients like Aquaphor, CeraVe, or Eucerin several times throughout the day, especially after washing your hands. You were given information for two dermatology offices and should contact one to schedule a follow-up visit for further evaluation and long-term care. If your rash becomes worse, starts draining, develops swelling, redness, warmth, or pain, or if you develop a fever, please seek medical care right away. Go to the emergency department if you notice spreading redness, severe pain, or signs of infection. If your rash does not improve within a few weeks, follow up with your primary care provider or the dermatologist for re-evaluation.
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
## 2024-02-15 NOTE — ED Triage Notes (Signed)
 Patient presents to the office for bilateral rash on his hands.

## 2024-05-05 ENCOUNTER — Encounter (HOSPITAL_COMMUNITY): Payer: Self-pay | Admitting: *Deleted

## 2024-05-05 ENCOUNTER — Other Ambulatory Visit: Payer: Self-pay

## 2024-05-05 ENCOUNTER — Ambulatory Visit (HOSPITAL_COMMUNITY)
Admission: EM | Admit: 2024-05-05 | Discharge: 2024-05-05 | Disposition: A | Discharge status: Home / Self Care | Attending: Emergency Medicine | Admitting: Emergency Medicine

## 2024-05-05 ENCOUNTER — Telehealth (HOSPITAL_COMMUNITY): Payer: Self-pay | Admitting: *Deleted

## 2024-05-05 DIAGNOSIS — L249 Irritant contact dermatitis, unspecified cause: Secondary | ICD-10-CM

## 2024-05-05 MED ORDER — BETAMETHASONE DIPROPIONATE 0.05 % EX OINT: TOPICAL_OINTMENT | Freq: Two times a day (BID) | CUTANEOUS | 0 refills | Status: DC

## 2024-05-05 MED ORDER — PREDNISONE 10 MG (21) PO TBPK: ORAL_TABLET | Freq: Every day | ORAL | 0 refills | Status: DC

## 2024-05-05 NOTE — Telephone Encounter (Signed)
 Called pharmacy at 8:12pm they are closed now
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
## 2024-05-05 NOTE — Discharge Instructions (Addendum)
 Take the steroid taper pack daily with breakfast as prescribed Benadryl as needed for itching  Use topical steroid cream BID to the hands and arms only, I suggest messing with Aquaphor or Lubriderm  Avoid using the topical steroid cream on your face or groin, as the skin is thinner and it may cause skin thinning and chronic skin discoloration  Use the steroid cream for 2 weeks at most.  Follow-up with a primary care provider for ongoing management of your chronic dermatitis.
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
## 2024-05-06 NOTE — Telephone Encounter (Signed)
 Please advise what medication should be sent in. Thanks.

## 2024-05-21 ENCOUNTER — Ambulatory Visit (HOSPITAL_COMMUNITY)
Admission: EM | Admit: 2024-05-21 | Discharge: 2024-05-21 | Disposition: A | Discharge status: Home / Self Care | Attending: Emergency Medicine | Admitting: Emergency Medicine

## 2024-05-21 ENCOUNTER — Encounter (HOSPITAL_COMMUNITY): Payer: Self-pay

## 2024-05-21 DIAGNOSIS — L249 Irritant contact dermatitis, unspecified cause: Secondary | ICD-10-CM | POA: Diagnosis not present

## 2024-05-21 MED ORDER — FLUOCINONIDE 0.05 % EX CREA: 1.0000 | TOPICAL_CREAM | Freq: Two times a day (BID) | CUTANEOUS | 0 refills | Status: AC

## 2024-05-21 MED ORDER — PREDNISONE 20 MG PO TABS: 40.0000 mg | ORAL_TABLET | Freq: Every day | ORAL | 0 refills | Status: AC
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
## 2024-05-21 NOTE — Discharge Instructions (Signed)
 Start taking 2 tablets of prednisone  once daily for 5 days. Apply fluocinonide  cream twice daily to your hands and arms.  Avoid applying this to your face as it can cause skin bleaching and thinning. Otherwise you can apply Vaseline 2 times a day to the affected area. I provided you with an ambulatory referral to dermatology.  If you do not hear from them within 1 week call to schedule an appointment for further evaluation of this.

## 2025-02-05 ENCOUNTER — Ambulatory Visit: Admitting: Physician Assistant
